# Patient Record
Sex: Female | Born: 1935 | Race: White | Hispanic: No | Marital: Married
Health system: Southern US, Community
[De-identification: ages and names within clinical notes are randomized; demographics above are authoritative.]

---

## 1998-07-07 ENCOUNTER — Encounter: Payer: Self-pay | Admitting: Family Medicine

## 1998-07-07 ENCOUNTER — Ambulatory Visit (HOSPITAL_COMMUNITY): Admission: RE | Admit: 1998-07-07 | Discharge: 1998-07-07 | Payer: Self-pay | Admitting: Family Medicine

## 1998-12-05 ENCOUNTER — Inpatient Hospital Stay (HOSPITAL_COMMUNITY): Admission: EM | Admit: 1998-12-05 | Discharge: 1998-12-08 | Payer: Self-pay | Admitting: Emergency Medicine

## 1998-12-07 ENCOUNTER — Encounter: Payer: Self-pay | Admitting: Gastroenterology

## 1999-01-18 ENCOUNTER — Ambulatory Visit (HOSPITAL_COMMUNITY): Admission: RE | Admit: 1999-01-18 | Discharge: 1999-01-18 | Payer: Self-pay | Admitting: Gastroenterology

## 1999-07-12 ENCOUNTER — Ambulatory Visit (HOSPITAL_COMMUNITY): Admission: RE | Admit: 1999-07-12 | Discharge: 1999-07-12 | Payer: Self-pay | Admitting: Family Medicine

## 1999-07-12 ENCOUNTER — Encounter: Payer: Self-pay | Admitting: Family Medicine

## 2000-08-08 ENCOUNTER — Ambulatory Visit (HOSPITAL_COMMUNITY): Admission: RE | Admit: 2000-08-08 | Discharge: 2000-08-08 | Payer: Self-pay | Admitting: Family Medicine

## 2000-08-08 ENCOUNTER — Encounter: Payer: Self-pay | Admitting: Family Medicine

## 2000-08-13 ENCOUNTER — Encounter: Payer: Self-pay | Admitting: Family Medicine

## 2000-08-13 ENCOUNTER — Encounter: Admission: RE | Admit: 2000-08-13 | Discharge: 2000-08-13 | Payer: Self-pay | Admitting: Family Medicine

## 2001-08-26 ENCOUNTER — Ambulatory Visit (HOSPITAL_COMMUNITY): Admission: RE | Admit: 2001-08-26 | Discharge: 2001-08-26 | Payer: Self-pay | Admitting: Family Medicine

## 2001-08-26 ENCOUNTER — Encounter: Payer: Self-pay | Admitting: Family Medicine

## 2002-08-27 ENCOUNTER — Ambulatory Visit (HOSPITAL_COMMUNITY): Admission: RE | Admit: 2002-08-27 | Discharge: 2002-08-27 | Payer: Self-pay | Admitting: Family Medicine

## 2002-08-27 ENCOUNTER — Encounter: Payer: Self-pay | Admitting: Family Medicine

## 2003-06-21 ENCOUNTER — Encounter: Admission: RE | Admit: 2003-06-21 | Discharge: 2003-06-21 | Payer: Self-pay | Admitting: Family Medicine

## 2004-07-13 ENCOUNTER — Encounter: Admission: RE | Admit: 2004-07-13 | Discharge: 2004-07-13 | Payer: Self-pay | Admitting: Family Medicine

## 2004-08-11 ENCOUNTER — Ambulatory Visit: Payer: Self-pay | Admitting: Surgery

## 2004-08-24 ENCOUNTER — Ambulatory Visit: Payer: Self-pay | Admitting: Surgery

## 2004-09-11 ENCOUNTER — Other Ambulatory Visit: Payer: Self-pay

## 2004-09-15 ENCOUNTER — Ambulatory Visit: Payer: Self-pay | Admitting: Surgery

## 2004-09-19 ENCOUNTER — Ambulatory Visit: Payer: Self-pay | Admitting: Surgery

## 2005-06-13 ENCOUNTER — Encounter: Admission: RE | Admit: 2005-06-13 | Discharge: 2005-06-13 | Payer: Self-pay | Admitting: Family Medicine

## 2005-07-04 ENCOUNTER — Encounter: Admission: RE | Admit: 2005-07-04 | Discharge: 2005-07-04 | Payer: Self-pay | Admitting: Surgery

## 2005-07-10 ENCOUNTER — Encounter: Admission: RE | Admit: 2005-07-10 | Discharge: 2005-07-10 | Payer: Self-pay | Admitting: Surgery

## 2005-07-13 ENCOUNTER — Ambulatory Visit (HOSPITAL_COMMUNITY): Admission: RE | Admit: 2005-07-13 | Discharge: 2005-07-13 | Payer: Self-pay | Admitting: Surgery

## 2005-07-13 ENCOUNTER — Ambulatory Visit (HOSPITAL_BASED_OUTPATIENT_CLINIC_OR_DEPARTMENT_OTHER): Admission: RE | Admit: 2005-07-13 | Discharge: 2005-07-13 | Payer: Self-pay | Admitting: Surgery

## 2005-07-26 ENCOUNTER — Ambulatory Visit: Payer: Self-pay | Admitting: Internal Medicine

## 2005-11-15 ENCOUNTER — Other Ambulatory Visit: Payer: Self-pay

## 2005-11-15 ENCOUNTER — Emergency Department: Payer: Self-pay | Admitting: Emergency Medicine

## 2007-05-01 ENCOUNTER — Ambulatory Visit: Payer: Self-pay | Admitting: General Surgery

## 2007-05-14 IMAGING — CT CT CHEST W/ CM
1 of 2 series · 14 of 32 positions shown, 18 images · non-contrast
Comparison: none

REASON FOR EXAM: chest pain
COMMENTS:

[Series 5: lung windows · axial · 0.74mm/px · z∈[+130,+358]mm · 14 of 90 slices shown, 18 images]
[im 7/90  mediastinal]
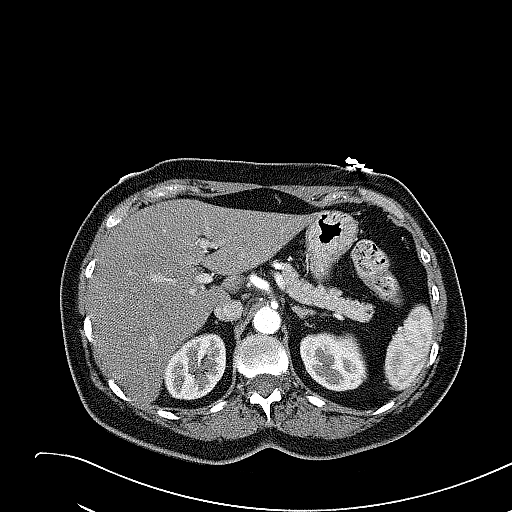
[im 7/90  lung]
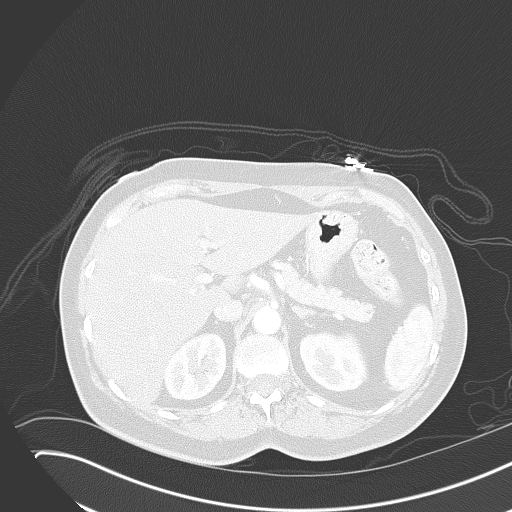
[im 14/90  lung]
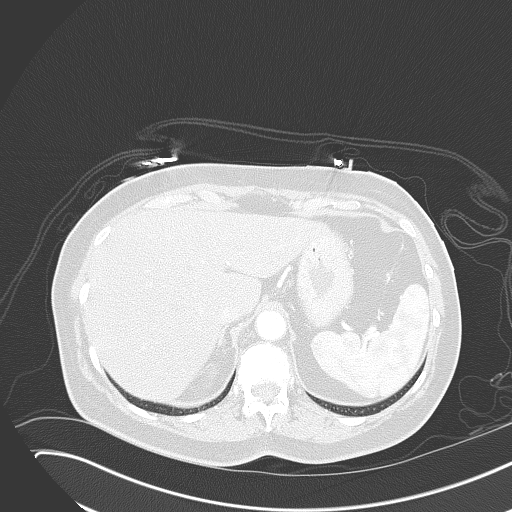
[im 21/90  lung]
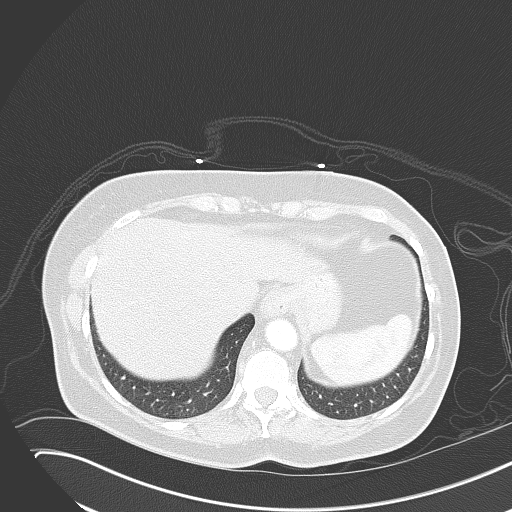
[im 28/90  lung]
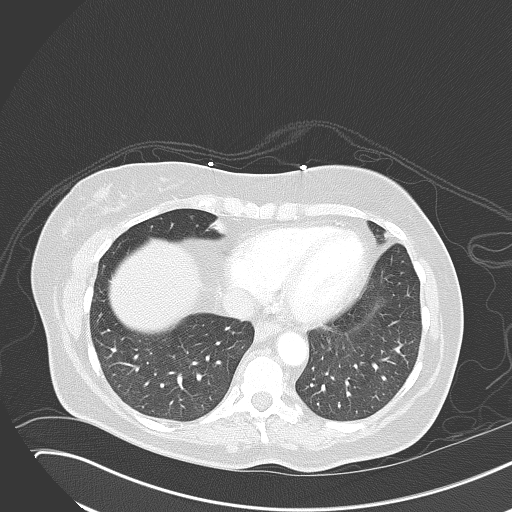
[im 35/90  mediastinal]
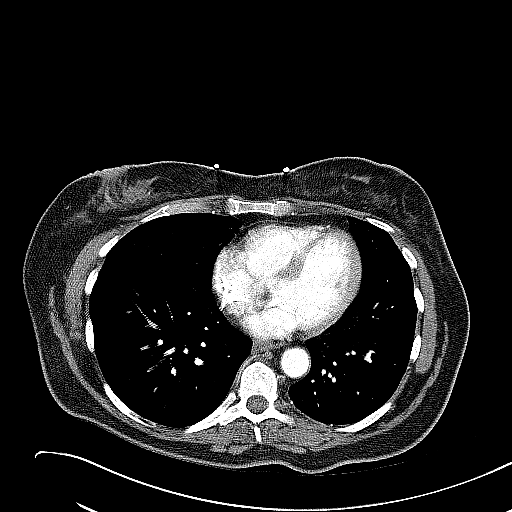
[im 35/90  lung]
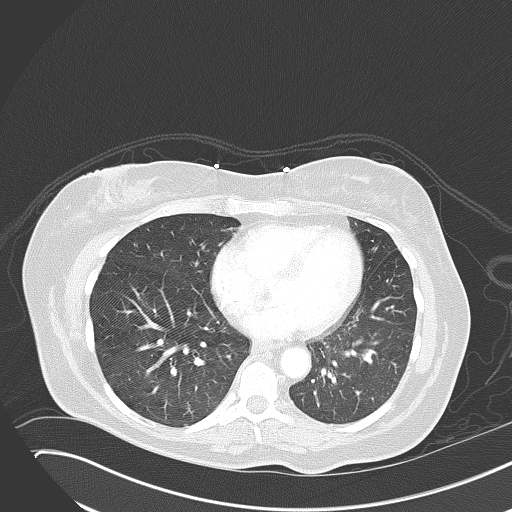
[im 41/90  lung]
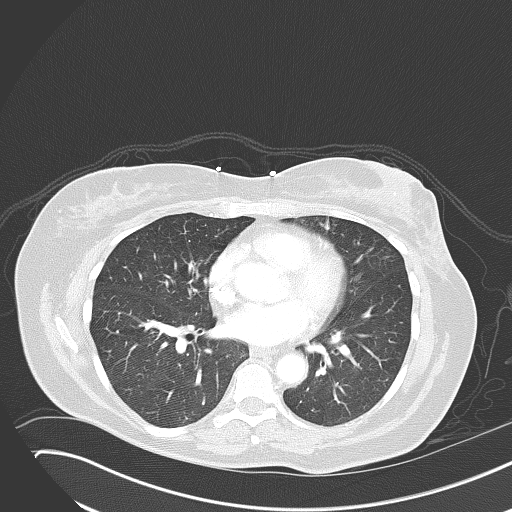
[im 42/90  lung]
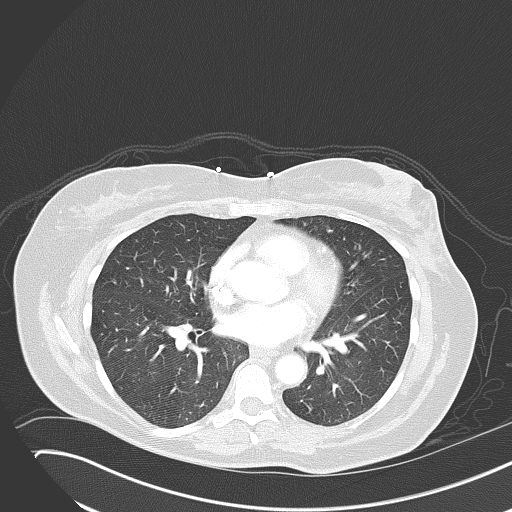
[im 45/90  lung]
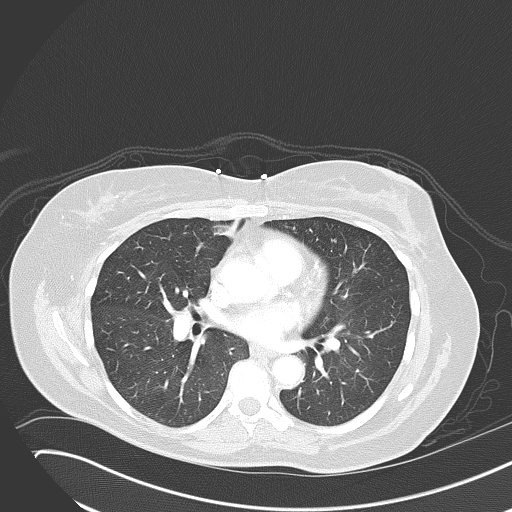
[im 48/90  mediastinal]
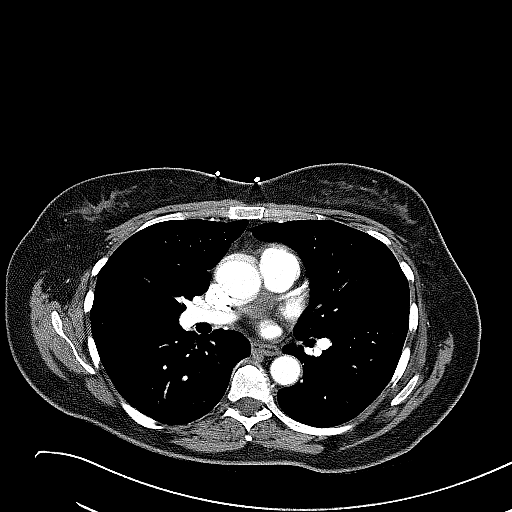
[im 48/90  lung]
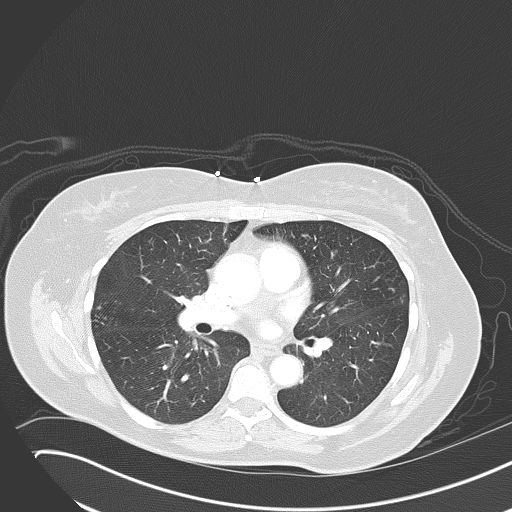
[im 55/90  lung]
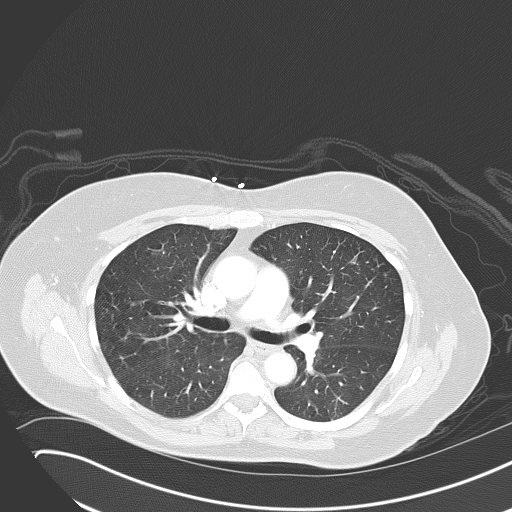
[im 62/90  lung]
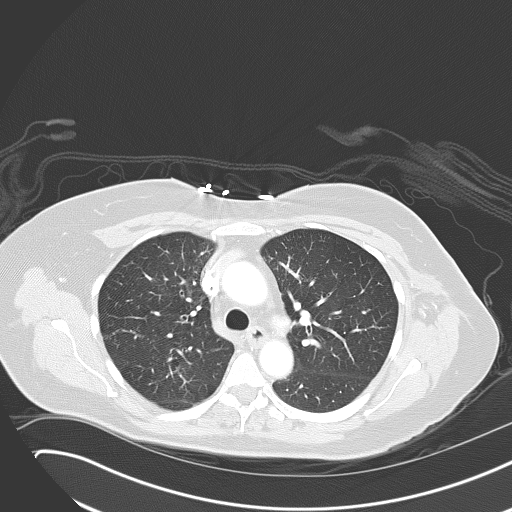
[im 69/90  lung]
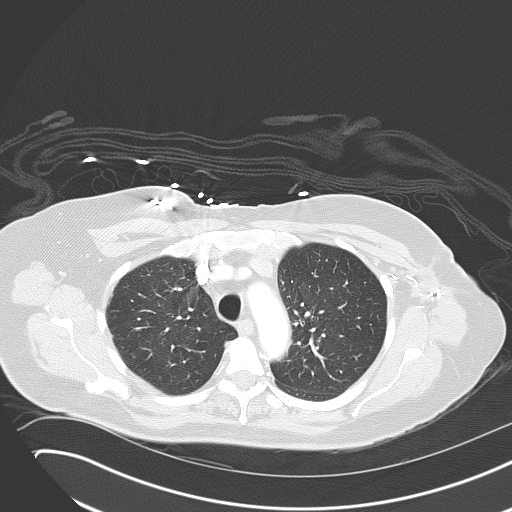
[im 76/90  mediastinal]
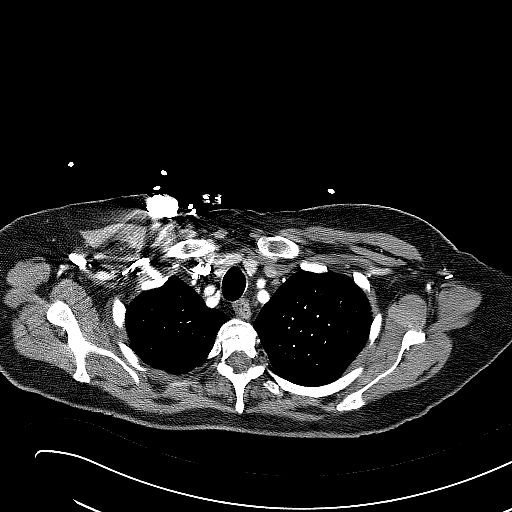
[im 76/90  lung]
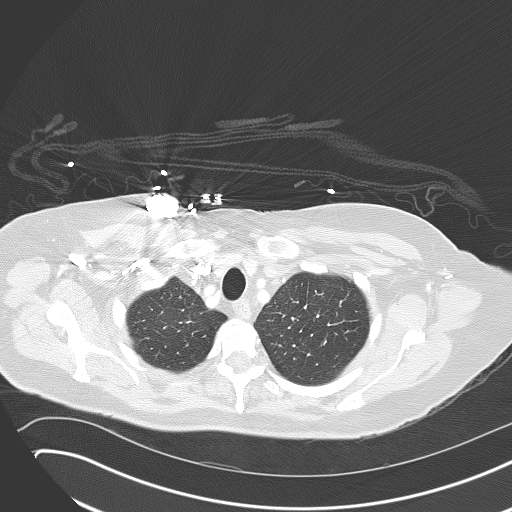
[im 83/90  lung]
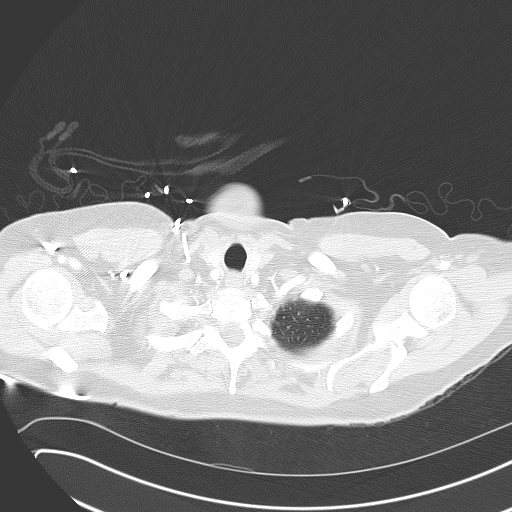

[14 of 32 positions shown; findings below may reference images not displayed]

PROCEDURE:     CT  - CT CHEST (FOR PE) W  - November 15, 2005  [DATE]

RESULT:     The patient is being evaluated for possible acute pulmonary
embolism.  The patient received 100 ml of Isovue 370.

Contrast within the pulmonary arterial tree is normal in appearance. I do
not see evidence of an acute pulmonary embolism. The caliber of the thoracic
aorta is normal. I see no pathologic sized mediastinal or hilar lymph nodes.
The cardiac chambers are not enlarged. There is soft tissue density noted in
the LEFT axillary region where there has been a prior axillary lymph node
dissection.  This could reflect a seroma, but correlation with the patient's
clinical and surgical history will be needed.

There is a small amount of apical pleural scarring on the RIGHT. I see no
abnormal pulmonary nodules.  Minimal increased interstitial density is seen
bilaterally in subpleural locations.

Within the upper abdomen the observed portions of the liver are normal.  I
see no adrenal masses. There are surgical clips in the gallbladder fossa.
IMPRESSION: 1)I see no evidence of an acute pulmonary embolism.

2)There is no evidence of pneumonia nor abnormal pulmonary parenchymal
nodules.

3)There is no evidence of CHF. I see no thoracic aortic abnormality.

4)There are surgical clips in the LEFT axillary region likely from prior
axillary lymph node dissection. The patient has a known Port-A-Cath
appliance in place.  There may be a small seroma in the region of the LEFT
axilla.  Correlation with any symptoms here is needed.

5)In the upper abdomen I see no acute abnormality.

The findings were called to the [HOSPITAL] the conclusion of
the study.

## 2013-01-29 ENCOUNTER — Observation Stay: Payer: Self-pay | Admitting: Internal Medicine

## 2013-01-29 LAB — CBC
MCH: 32.3 pg (ref 26.0–34.0)
MCV: 96 fL (ref 80–100)
Platelet: 272 10*3/uL (ref 150–440)
RDW: 12.4 % (ref 11.5–14.5)
WBC: 5.7 10*3/uL (ref 3.6–11.0)

## 2013-01-29 LAB — BASIC METABOLIC PANEL
Anion Gap: 6 — ABNORMAL LOW (ref 7–16)
BUN: 17 mg/dL (ref 7–18)
Chloride: 104 mmol/L (ref 98–107)
EGFR (African American): >60
Potassium: 4 mmol/L (ref 3.5–5.1)
Sodium: 137 mmol/L (ref 136–145)

## 2013-01-29 LAB — TROPONIN I: Troponin-I: <0.02 ng/mL

## 2013-01-30 LAB — LIPID PANEL: VLDL Cholesterol, Calc: 24 mg/dL (ref 5–40)

## 2013-01-30 LAB — TROPONIN I: Troponin-I: <0.02 ng/mL

## 2014-02-15 ENCOUNTER — Other Ambulatory Visit: Payer: Self-pay | Admitting: Otolaryngology

## 2014-02-15 ENCOUNTER — Ambulatory Visit
Admission: RE | Admit: 2014-02-15 | Discharge: 2014-02-15 | Disposition: A | Discharge status: Home / Self Care | Payer: Medicare Other | Source: Ambulatory Visit | Attending: Otolaryngology | Admitting: Otolaryngology

## 2014-02-15 DIAGNOSIS — J0101 Acute recurrent maxillary sinusitis: Secondary | ICD-10-CM

## 2014-02-17 LAB — BASIC METABOLIC PANEL
ANION GAP: 9 (ref 7–16)
BUN: 13 mg/dL (ref 7–18)
CALCIUM: 9.5 mg/dL (ref 8.5–10.1)
CHLORIDE: 98 mmol/L (ref 98–107)
CO2: 26 mmol/L (ref 21–32)
Creatinine: 0.69 mg/dL (ref 0.60–1.30)
EGFR (African American): >60
EGFR (Non-African Amer.): >60
GLUCOSE: 84 mg/dL (ref 65–99)
OSMOLALITY: 266 (ref 275–301)
Potassium: 3.5 mmol/L (ref 3.5–5.1)
Sodium: 133 mmol/L — ABNORMAL LOW (ref 136–145)

## 2014-02-17 LAB — CBC
HCT: 34.9 % — AB (ref 35.0–47.0)
HGB: 11.6 g/dL — AB (ref 12.0–16.0)
MCH: 33.1 pg (ref 26.0–34.0)
MCHC: 33.2 g/dL (ref 32.0–36.0)
MCV: 100 fL (ref 80–100)
PLATELETS: 314 10*3/uL (ref 150–440)
RBC: 3.5 10*6/uL — ABNORMAL LOW (ref 3.80–5.20)
RDW: 12.3 % (ref 11.5–14.5)
WBC: 7.8 10*3/uL (ref 3.6–11.0)

## 2014-02-17 LAB — SEDIMENTATION RATE: ERYTHROCYTE SED RATE: 65 mm/h — AB (ref 0–30)

## 2014-02-18 ENCOUNTER — Observation Stay: Payer: Self-pay | Admitting: Otolaryngology

## 2014-02-19 LAB — BASIC METABOLIC PANEL
Anion Gap: 9 (ref 7–16)
BUN: 14 mg/dL (ref 7–18)
CHLORIDE: 99 mmol/L (ref 98–107)
Calcium, Total: 8.9 mg/dL (ref 8.5–10.1)
Co2: 27 mmol/L (ref 21–32)
Creatinine: 0.63 mg/dL (ref 0.60–1.30)
Glucose: 112 mg/dL — ABNORMAL HIGH (ref 65–99)
Osmolality: 271 (ref 275–301)
Potassium: 3.3 mmol/L — ABNORMAL LOW (ref 3.5–5.1)
SODIUM: 135 mmol/L — AB (ref 136–145)

## 2014-02-20 LAB — PATHOLOGY REPORT

## 2014-02-22 LAB — CULTURE, BLOOD (SINGLE)

## 2014-02-22 LAB — WOUND CULTURE

## 2014-11-12 NOTE — Discharge Summary (Signed)
PATIENT NAME:  Jasmin Meyers, [name] MR#:  [id number] DATE OF BIRTH:  Jun 15, 1936  DATE OF ADMISSION:  01/29/2013 DATE OF DISCHARGE:  01/30/2013  For a detailed note, please take a look at the history and physical done on admission.   DIAGNOSES AT DISCHARGE: 1.  Chest pain, likely musculoskeletal in nature.  2.  Gastroesophageal reflux disease.  3.  Glaucoma.  4.  Osteoarthritis.   DIET:  The patient is being discharged on a regular diet.   ACTIVITY:  As tolerated.   FOLLOW-UP:  Is in the next 1 to 2 weeks with her primary care physician Dr. Jeannetta NapElkins.   DISCHARGE MEDICATIONS:  Meloxicam 15 mg daily, omeprazole 20 mg 3 times daily, Travatan eye drops one drop to each affected eye in the evening, Combigan 0.2/0.5% ophthalmic solution one drop to each affected eye twice daily.   PERTINENT STUDIES DONE DURING THE HOSPITAL COURSE:  A chest x-ray on admission showing probable chronic interstitial disease with mild hyperinflation.  A nuclear medicine stress test done the day after admission showing no significant wall motion abnormality, EF of 77%.  No EKG changes concerning for ischemia.  Normal myocardial perfusion study.   HOSPITAL COURSE:  This is a 79 year old female with medical problems as mentioned above, presented to the hospital with chest pain.  1.  Chest pain.  The patient was observed overnight on telemetry, had three sets of cardiac markers checked, which were negative.  She underwent a nuclear medicine stress test the day after admission which showed no significant wall motion abnormalities and was essentially normal.  The patient is therefore being discharged home with close follow-up with her primary care physician and Dr. Jeannetta NapElkins as an outpatient.  The most likely cause of her chest pain was either musculoskeletal or related to her GERD and this can be further followed up as an outpatient.  2.  GERD.  The patient will continue her Prilosec which is what she was maintained on. 3.  Osteoarthritis.   The patient was maintained on her meloxicam.  She will resume that.  4.  Glaucoma.  The patient was maintained on her Combigan and Travatan eye drops and she will also resume that upon discharge.   CODE STATUS:  The patient is a FULL CODE.   Time spent on discharge is 35 minutes.    ____________________________ Rolly PancakeVivek J. Cherlynn KaiserSainani, MD vjs:ea D: 01/30/2013 16:17:13 ET T: 01/31/2013 00:31:18 ET JOB#: [id number]  cc: Rolly PancakeVivek J. Cherlynn KaiserSainani, MD, <Dictator> Hadassah PaisWilson O. Jeannetta NapElkins, MD Houston SirenVIVEK J SAINANI MD ELECTRONICALLY SIGNED 02/02/2013 20:41

## 2014-11-12 NOTE — H&P (Signed)
PATIENT NAME:  Jasmin Meyers, Jasmin Meyers MR#:  [id number] DATE OF BIRTH:  [birth year]  DATE OF ADMISSION:  01/29/2013  PRIMARY CARE PHYSICIAN[facility]  CHIEF COMPLAINT: Chest pain and shortness of breath on and off for two months.   CONDITION ON DISCHARGE: Ms. Jasmin LeylandOien is a 79 year old, very pleasant Caucasian female with past medical history of acid reflux and arthritis, comes to the Emergency Room after she started noticing increasing shortness of breath and chest pain on and off for the past two months. This morning, she had chest discomfort, along with numbness and tingling in the left arm ,which caused her to go to Dr. Hennie DuosElkin's office who sent her here for further evaluation and management. In the Emergency Room, the patient does not have any chest pain. Her blood pressure is stable. She is having some minimal on her left arm. Her first set of cardiac enzymes were negative. EKG does not show any acute changes. She is being admitted for further evaluation on her chest pain and shortness of breath.   PAST MEDICAL HISTORY: 1.  History of acid reflux.  2.  Glaucoma.  3.  Gastroesophageal reflux disease.  4.  History of breast cancer.  5.  History of hysterectomy.  6.  Tonsillectomy and adenoidectomy.  7.  Lumpectomy.  8.  Cholecystectomy.   MEDICATIONS:   1.  Travatan 0.04% drops, one drop each affected eye more evening.  2.   Combigan 0.22, 0.5% one drop affected eyes b.i.d. 3.  Meloxicam 15 mg p.o. daily.  4.  Omeprazole 20 mg t.i.d.   ALLERGIES: CODEINE, SULFA AND LATEX.    FAMILY HISTORY: Strong family history of myocardial infarction in father, mother and brother.   SOCIAL HISTORY: Married, lives at home with her husband. Nonsmoker. Non-alcoholic.   REVIEW OF SYSTEMS:  CONSTITUTIONAL: No fever, fatigue, weakness.  EYES: No blurred or double vision.  ENT: No tinnitus, ear pain, hearing loss.  RESPIRATORY: No cough, wheeze, hemoptysis.   CARDIOVASCULAR: No orthopnea, edema, hypertension or palpitations.  GASTROINTESTINAL: No nausea, vomiting, diarrhea, abdominal pain.  GENITOURINARY: No dysuria or hematuria.  ENDOCRINE: No polyuria or nocturia.  HEMATOLOGY: No anemia or easy bruising.  SKIN: No acne or rash.  MUSCULOSKELETAL: Positive for arthritis. No swelling of the joint or gout.  NEUROLOGIC: No CVA or transient ischemic attack, dementia, or dysarthria.  PSYCHIATRIC: No anxiety, depression or bipolar disorder.  All other systems reviewed and are negative.   PHYSICAL EXAMINATION: GENERAL: The patient is awake, alert, oriented x 3, not in acute distress.  VITAL SIGNS: Afebrile, pulse is 66, blood pressure is 118/66, sats are 97% on room air.  HEENT: Atraumatic, normocephalic. Pupils are equal, round and reactive to light and accommodation. EOMI intact. Oral mucosa is moist.  NECK: Supple. No JVD. No carotid bruit.  RESPIRATORY: Clear to auscultation bilaterally. No rales, rhonchi, respiratory distress or labored breathing.  CARDIOVASCULAR: Both the heart sounds are normal. Rate and rhythm is regular. PMI not lateralized. Chest is nontender.  EXTREMITIES: Good pedal pulses, good femoral pulses. No lower extremity edema.  ABDOMEN: Soft, nontender. No organomegaly. Positive bowel sounds.  NEUROLOGIC: Grossly intact cranial nerves II through XII. No motor or sensory deficit.  PSYCHIATRIC: The patient is awake, alert, oriented x 3.   LABORATORY DATA: Cardiac enzymes: First set negative.   Chest X-Ray: Possible chronic interstitial lung diseases with mild hyperinflation.  CBC within normal limits.  Basic metabolic panel within normal limits.  Cardiac troponin  is 0.02.  EKG shows normal sinus rhythm with Q waves in in anterior leads; appears old.   ASSESSMENT AND PLAN: A 79 year old Jasmin Meyers with history of acid reflux, who comes in with increasing shortness of breath and chest pain. She is being admitted for:  1.   Chest pain with on and off shortness of breath, fatigue and weakness. We will admit her on observation for overnight telemetry. We will keep her and give her regular diet. Continue aspirin. Blood pressure is elevated on the lower side, hold off on beta blockers. We will cycle cardiac enzymes x 3, check lipid profile, check TSH and consider doing Myoview stress test tomorrow morning. If the patient rules in, then cardiology consultation.  2.  Gastroesophageal reflux disease. Continue omeprazole.  3.  Glaucoma. Continue eye drops.   The above was discussed with the patient and the patient's husband and is agreeable to it.   TIME SPENT: 50 minutes    ____________________________ Jasmin Klinefelter A. Allena Katz, Meyers sap:cc D: 01/29/2013 16:39:16 ET T: 01/29/2013 17:13:27 ET JOB#: [id number]  cc: Jasmin Bobier A. Allena Katz, Meyers, <Dictator> Hadassah Pais. Jasmin Nap, Meyers Jasmin Meyers ELECTRONICALLY SIGNED 02/09/2013 19:02

## 2014-11-13 NOTE — H&P (Signed)
PATIENT NAME:  Jasmin Meyers, Marshelle MR#:  [id number] DATE OF BIRTH:  06-Jul-1936  DATE OF ADMISSION:  02/17/2014  REFERRING PHYSICIAN:  ER.  CONSULTING PHYSICIAN: Anthoney HaradaPaul Scott Tamyia Minich, M.D.   REASON FOR CONSULTATION: Ethmoid sinusitis with orbital complications.   HISTORY OF PRESENT ILLNESS: A 79 year old female developed a sinus headache last week, initially treated with tramadol pain control by her primary care, but ultimately, she was referred to her [place] in [place] on Monday. At that time, apparently she had some double vision and drooping of her lid with difficulty moving the eye. Reportedly, she had a CT scan, probably at the ENT physician's office, that showed no evidence of any type of abscess. Full results are not available. He put her on Augmentin and had her see an ophthalmologist. They recommended she get an MRI, but when scheduling of the MRI, was found to be difficult, she was directed to go to the Emergency Room and [facility]. She came in earlier today. The case was discussed with Dr. Inez PilgrimBrasington and he agreed with proceeding with MRI. The MRI showed ethmoid sinusitis with some stranding of the orbital fat, but no evidence of abscess. The optic nerves appeared within normal limits and the cavernous sinus was deemed unremarkable.   She was evaluated in the Emergency Room by Dr. Inez PilgrimBrasington. Originally, there was concern that she might have a 3rd cranial nerve deficit; however, on review of her exam, he saw no evidence of optic nerve edema, and she did not appear to have the full spectrum of deficits to be expected with a cranial nerve III deficit. He felt she had localized effect involving the medial rectus and levator palpebral impairment from the orbital cellulitis, itself. I did obtain a CT scan which confirms sinusitis involving the anterior and posterior ethmoids. There does not appear to be any gross erosion of the lamina papyracea, although there is some areas of slight thinning  of the bone in the mid to posterior ethmoids on the left side. The ethmoids are completely to partially opacified on that side. There is some milder ethmoid disease on the right and some mild frontal sinus and frontal recess mucosal thickening, mild to moderate frontal sinus and frontal recess mucosal thickening and some minimal fluid level in the left sphenoid sinus.   The most significant disease is located within the ethmoid sinuses, themselves on the left. The maxillary sinuses are essentially clear. She has not had a history of recurrent sinusitis to any great degree, just some isolated sinusitis off and on in the past. She has not been running any fever. She does not have any proptosis as would be expected with typical orbital cellulitis. She does not have an elevated white count.   PAST MEDICAL HISTORY:  Gastroesophageal reflux, glaucoma, history of breast cancer with prior lumpectomy, radiation and chemotherapy in 2006 and 2007.   PAST SURGICAL HISTORY: Prior cholecystectomy, hysterectomy, breast lumpectomy.  MEDICATIONS:  Travatan one drop to each eye once daily in the evening. Tramadol 1 p.o. every 4 hours p.r.n. pain, Tessalon Perles 1 p.o. q.p.m., omeprazole 20 mg p.o. t.i.d., Combigan 0.2% one drop each eye every 12 hours. Augmentin 1 tablet p.o. every 12 hours.   ALLERGIES: CODEINE, SULFA DRUGS, LATEX.  SOCIAL HISTORY: She is a nonsmoker, denies regular alcohol use.   REVIEW OF SYSTEMS:  She has had some mild degree of nausea and vomiting while on the antibiotic, but no nausea or vomiting today. She has had headache, but no  fever. Denies shortness of breath, chest pain, rash, stiff neck, or free bleeding. She has had double vision, but has not had any significant blurring of her vision.   PHYSICAL EXAMINATION: VITAL SIGNS: Temperature is 98.4, pulse 92, blood pressure 122/59.  GENERAL: Well-developed, well-nourished female in no acute distress. She does not look septic at all.  HEAD  AND FACE EXAMINATION: Normocephalic, atraumatic. No facial skin lesions. Facial strength is normal and symmetric with exception of the lid ptosis involving the left eyelid.  EARS: External ears are unremarkable. Ear canals are free of cerumen. Tympanic membranes are clear bilaterally.  NOSE: External nose unremarkable. Nasal cavity is a little congested with some erythema and scant cloudy secretions, but no gross purulence is seen and there are no polyps. The septum is relatively straight. Oral cavity and oropharynx, teeth, lips and gums are unremarkable. Tongue and floor of mouth without lesions. Posterior pharynx is clear without erythema, exudate, or postnasal discharge.  NECK: Supple without adenopathy or mass or stiffness. There is no cervical lymphadenopathy. The salivary glands are soft and nontender without masses. There is no thyromegaly.   NEUROLOGIC: Cranial nerves II through XII are intact with the exception of the restriction of movement of the left eye, which has been evaluated by Dr. Inez Pilgrim. She clearly has restriction of medial and upward and downward gaze with no restriction of lateral gaze, but this is felt to be not a cranial nerve-related defect, and it appears to be related to more of a localized effect on the extraocular muscles, per his evaluation.  CARDIOVASCULAR: Regular, regular rate and rhythm without murmurs.  RESPIRATORY: Lungs are clear to auscultation bilaterally, but without wheezing, rales or rhonchi.   DATA REVIEW: White count is 7.8. Her sedimentation rate is elevated at 65. I have reviewed her CT scan and MRI with the findings detailed above.   ASSESSMENT: This patient has a very mild degree of orbital cellulitis complicating ethmoid sinusitis. She is already been on Augmentin and with the orbital complication, it is felt most prudent to admit her to the hospital, emergently perform a total ethmoidectomy on the left, and treat with IV antibiotics and steroids. I  have discussed risk of bleeding, eye injury,  retro-orbital hematoma, CSF leak, and she does agree to proceed with the procedure which we will try to get done this evening. I did not feel we need to explore the frontal sinuses or any of the right paranasal sinuses. Also, the findings in the left sphenoid sinus are minimal, just a minimal degree of fluid layering and I see no need to explore the sphenoid sinus, but we do need to decompress the ethmoid sinuses, which are partially to fully opacified on the left side. We will then keep her in the hospital for observation and the IV antibiotics until we are certain that she is showing a response to therapy, or at least stable with improvement in her retroorbital pain.    ____________________________ Ollen Gross. Willeen Cass, MD psb:ds D: 02/17/2014 21:06:47 ET T: 02/17/2014 21:48:55 ET JOB#: [id number]  cc: Ollen Gross. Willeen Cass, MD, <Dictator> Sandi Mealy MD ELECTRONICALLY SIGNED 03/02/2014 9:41

## 2014-11-13 NOTE — Op Note (Signed)
PATIENT NAME:  Jasmin Meyers, Jasmin Meyers MR#:  [id number] DATE OF BIRTH:  Apr 02, 1936  DATE OF PROCEDURE:  02/18/2014  PREOPERATIVE DIAGNOSIS: Acute left ethmoid sinusitis with orbital cellulitis.   POSTOPERATIVE DIAGNOSIS: Acute left ethmoid sinusitis with orbital cellulitis.   PROCEDURE: Left endoscopic total ethmoidectomy with computer-assisted image guided surgery.   SURGEON: Marion DownerScott Amiyrah Lamere, MD   ANESTHESIA: General endotracheal.   INDICATIONS: A 79 year old female with sinusitis causing a mild orbital cellulitis which in addition appears to have caused dysfunction of movement of the extraocular muscles.   FINDINGS: There was intense inflammation, polypoid mucosal thickening in the left ethmoid sinuses. Cultures were taken.   COMPLICATIONS: None.   DESCRIPTION OF PROCEDURE: After obtaining informed consent, the patient was taken to the operating room and placed in the supine position. After induction of general endotracheal anesthesia, the patient was turned 90 degrees. The nose was decongested with Afrin moistened pledgets. Then 1% lidocaine with epinephrine 1:200,000 was injected into the region of the uncinate process and middle turbinate. This was then prepped and draped in the usual sterile fashion. 0 degree scope was used to evaluate the nasal cavity. No gross purulence was seen. Additional injection of local was performed using a spinal needle, at this point. Prior to prepping and draping, the Stryker image guided mask had been applied in the usual fashion and the patient registered with the image guided system. Calibration was assessed with the image guided suction. Further recalibration was necessary to get accurate tracking. The middle turbinate was then medialized and the ethmoid bulla entered inferomedially. The dissection proceeded inferiorly and medially through the basal lamella using the image guided suction to reassess the anatomy during the dissection. The ethmoids were then sequentially  dissected frequently reassessing the anatomy with the image guided suction to make sure there was no injury to the skull base or lamina papyracea laterally. The dissection proceeded back to the posterior ethmoids basically to the region of the sphenoid and to the skull base superiorly. Anterior and superior dissection was done up towards the region of the frontal recess, although the frontal recess itself was not extensively dissected. To facilitate complete opening of the anterior ethmoids, a portion of the uncinate process was removed with a pediatric backbiter. During the dissection polypoid mucosal tissue was removed from a number of the occluded ethmoid air cells. A culture was taken from the ethmoids, although no frank purulence was encountered. At the completion of the procedure bleeding appeared to be under reasonable control with no need for surgical packing. She was returned to the anesthesiologist for awakening. She was awakened and taken to the recovery room in good condition postoperatively. Blood loss was approximately 25 mL. ____________________________ Ollen GrossPaul S. Willeen CassBennett, MD psb:sb D: 02/18/2014 00:51:22 ET T: 02/18/2014 07:34:05 ET JOB#: [id number]  cc: Ollen GrossPaul S. Willeen CassBennett, MD, <Dictator> Sandi MealyPAUL S Halle Davlin MD ELECTRONICALLY SIGNED 03/02/2014 9:41
